# Patient Record
Sex: Male | Born: 1945
Health system: Southern US, Community
[De-identification: ages and names within clinical notes are randomized; demographics above are authoritative.]

---

## 2005-04-04 ENCOUNTER — Ambulatory Visit: Payer: Self-pay | Admitting: Gastroenterology

## 2005-04-16 ENCOUNTER — Ambulatory Visit: Payer: Self-pay | Admitting: Gastroenterology

## 2005-04-22 ENCOUNTER — Ambulatory Visit: Payer: Self-pay | Admitting: Gastroenterology

## 2005-05-28 ENCOUNTER — Ambulatory Visit: Payer: Self-pay | Admitting: Gastroenterology

## 2010-08-06 ENCOUNTER — Encounter: Payer: Self-pay | Admitting: Critical Care Medicine

## 2017-12-23 DIAGNOSIS — H6690 Otitis media, unspecified, unspecified ear: Secondary | ICD-10-CM | POA: Diagnosis not present

## 2017-12-23 DIAGNOSIS — Z6823 Body mass index (BMI) 23.0-23.9, adult: Secondary | ICD-10-CM | POA: Diagnosis not present

## 2017-12-23 DIAGNOSIS — Z87891 Personal history of nicotine dependence: Secondary | ICD-10-CM | POA: Diagnosis not present

## 2017-12-23 DIAGNOSIS — J449 Chronic obstructive pulmonary disease, unspecified: Secondary | ICD-10-CM | POA: Diagnosis not present

## 2017-12-23 DIAGNOSIS — F419 Anxiety disorder, unspecified: Secondary | ICD-10-CM | POA: Diagnosis not present

## 2017-12-23 DIAGNOSIS — Z299 Encounter for prophylactic measures, unspecified: Secondary | ICD-10-CM | POA: Diagnosis not present

## 2017-12-24 DIAGNOSIS — J441 Chronic obstructive pulmonary disease with (acute) exacerbation: Secondary | ICD-10-CM | POA: Diagnosis not present

## 2017-12-29 DIAGNOSIS — E785 Hyperlipidemia, unspecified: Secondary | ICD-10-CM | POA: Diagnosis not present

## 2017-12-29 DIAGNOSIS — J449 Chronic obstructive pulmonary disease, unspecified: Secondary | ICD-10-CM | POA: Diagnosis not present

## 2017-12-29 DIAGNOSIS — I10 Essential (primary) hypertension: Secondary | ICD-10-CM | POA: Diagnosis not present

## 2017-12-29 DIAGNOSIS — M19012 Primary osteoarthritis, left shoulder: Secondary | ICD-10-CM | POA: Diagnosis not present

## 2017-12-29 DIAGNOSIS — Z6822 Body mass index (BMI) 22.0-22.9, adult: Secondary | ICD-10-CM | POA: Diagnosis not present

## 2017-12-29 DIAGNOSIS — Z9981 Dependence on supplemental oxygen: Secondary | ICD-10-CM | POA: Diagnosis not present

## 2017-12-29 DIAGNOSIS — F418 Other specified anxiety disorders: Secondary | ICD-10-CM | POA: Diagnosis not present

## 2018-01-13 DIAGNOSIS — Z23 Encounter for immunization: Secondary | ICD-10-CM | POA: Diagnosis not present

## 2018-01-13 DIAGNOSIS — Z6822 Body mass index (BMI) 22.0-22.9, adult: Secondary | ICD-10-CM | POA: Diagnosis not present

## 2018-01-13 DIAGNOSIS — J449 Chronic obstructive pulmonary disease, unspecified: Secondary | ICD-10-CM | POA: Diagnosis not present

## 2018-01-13 DIAGNOSIS — I10 Essential (primary) hypertension: Secondary | ICD-10-CM | POA: Diagnosis not present

## 2018-01-13 DIAGNOSIS — Z299 Encounter for prophylactic measures, unspecified: Secondary | ICD-10-CM | POA: Diagnosis not present

## 2018-01-13 DIAGNOSIS — K146 Glossodynia: Secondary | ICD-10-CM | POA: Diagnosis not present

## 2018-01-23 DIAGNOSIS — J441 Chronic obstructive pulmonary disease with (acute) exacerbation: Secondary | ICD-10-CM | POA: Diagnosis not present

## 2018-02-03 DIAGNOSIS — E039 Hypothyroidism, unspecified: Secondary | ICD-10-CM | POA: Diagnosis not present

## 2018-02-03 DIAGNOSIS — Z1331 Encounter for screening for depression: Secondary | ICD-10-CM | POA: Diagnosis not present

## 2018-02-03 DIAGNOSIS — Z0000 Encounter for general adult medical examination without abnormal findings: Secondary | ICD-10-CM | POA: Diagnosis not present

## 2018-02-03 DIAGNOSIS — Z6823 Body mass index (BMI) 23.0-23.9, adult: Secondary | ICD-10-CM | POA: Diagnosis not present

## 2018-02-03 DIAGNOSIS — Z299 Encounter for prophylactic measures, unspecified: Secondary | ICD-10-CM | POA: Diagnosis not present

## 2018-02-03 DIAGNOSIS — Z1339 Encounter for screening examination for other mental health and behavioral disorders: Secondary | ICD-10-CM | POA: Diagnosis not present

## 2018-02-03 DIAGNOSIS — F419 Anxiety disorder, unspecified: Secondary | ICD-10-CM | POA: Diagnosis not present

## 2018-02-03 DIAGNOSIS — Z1211 Encounter for screening for malignant neoplasm of colon: Secondary | ICD-10-CM | POA: Diagnosis not present

## 2018-02-03 DIAGNOSIS — Z7189 Other specified counseling: Secondary | ICD-10-CM | POA: Diagnosis not present

## 2018-02-03 DIAGNOSIS — Z79899 Other long term (current) drug therapy: Secondary | ICD-10-CM | POA: Diagnosis not present

## 2018-02-03 DIAGNOSIS — I10 Essential (primary) hypertension: Secondary | ICD-10-CM | POA: Diagnosis not present

## 2018-02-03 DIAGNOSIS — Z125 Encounter for screening for malignant neoplasm of prostate: Secondary | ICD-10-CM | POA: Diagnosis not present

## 2018-02-09 ENCOUNTER — Ambulatory Visit (INDEPENDENT_AMBULATORY_CARE_PROVIDER_SITE_OTHER): Payer: Medicare HMO | Admitting: Otolaryngology

## 2018-02-09 DIAGNOSIS — R070 Pain in throat: Secondary | ICD-10-CM

## 2018-02-09 DIAGNOSIS — H9201 Otalgia, right ear: Secondary | ICD-10-CM | POA: Diagnosis not present

## 2018-02-23 DIAGNOSIS — J441 Chronic obstructive pulmonary disease with (acute) exacerbation: Secondary | ICD-10-CM | POA: Diagnosis not present

## 2018-02-25 ENCOUNTER — Other Ambulatory Visit (INDEPENDENT_AMBULATORY_CARE_PROVIDER_SITE_OTHER): Payer: Self-pay | Admitting: Otolaryngology

## 2018-02-25 DIAGNOSIS — K148 Other diseases of tongue: Secondary | ICD-10-CM

## 2018-02-25 DIAGNOSIS — R220 Localized swelling, mass and lump, head: Principal | ICD-10-CM

## 2018-03-03 ENCOUNTER — Ambulatory Visit (HOSPITAL_COMMUNITY)
Admission: RE | Admit: 2018-03-03 | Discharge: 2018-03-03 | Disposition: A | Discharge status: Home / Self Care | Payer: Medicare HMO | Source: Ambulatory Visit | Attending: Otolaryngology | Admitting: Otolaryngology

## 2018-03-03 DIAGNOSIS — R220 Localized swelling, mass and lump, head: Secondary | ICD-10-CM

## 2018-03-03 DIAGNOSIS — K148 Other diseases of tongue: Secondary | ICD-10-CM | POA: Insufficient documentation

## 2018-03-03 LAB — POCT I-STAT CREATININE: Creatinine, Ser: 1 mg/dL (ref 0.61–1.24)

## 2018-03-03 MED ORDER — GADOBUTROL 1 MMOL/ML IV SOLN
5.0000 mL | Freq: Once | INTRAVENOUS | Status: AC | PRN
  Administered 2018-03-03: 5 mL via INTRAVENOUS

## 2018-03-25 DIAGNOSIS — J441 Chronic obstructive pulmonary disease with (acute) exacerbation: Secondary | ICD-10-CM | POA: Diagnosis not present

## 2018-04-25 DIAGNOSIS — J441 Chronic obstructive pulmonary disease with (acute) exacerbation: Secondary | ICD-10-CM | POA: Diagnosis not present

## 2018-05-07 DIAGNOSIS — Z299 Encounter for prophylactic measures, unspecified: Secondary | ICD-10-CM | POA: Diagnosis not present

## 2018-05-07 DIAGNOSIS — J449 Chronic obstructive pulmonary disease, unspecified: Secondary | ICD-10-CM | POA: Diagnosis not present

## 2018-05-07 DIAGNOSIS — Z87891 Personal history of nicotine dependence: Secondary | ICD-10-CM | POA: Diagnosis not present

## 2018-05-07 DIAGNOSIS — I10 Essential (primary) hypertension: Secondary | ICD-10-CM | POA: Diagnosis not present

## 2018-05-07 DIAGNOSIS — Z6824 Body mass index (BMI) 24.0-24.9, adult: Secondary | ICD-10-CM | POA: Diagnosis not present

## 2018-05-13 DIAGNOSIS — I10 Essential (primary) hypertension: Secondary | ICD-10-CM | POA: Diagnosis not present

## 2018-05-13 DIAGNOSIS — Z87891 Personal history of nicotine dependence: Secondary | ICD-10-CM | POA: Diagnosis not present

## 2018-05-13 DIAGNOSIS — R29715 NIHSS score 15: Secondary | ICD-10-CM | POA: Diagnosis not present

## 2018-05-13 DIAGNOSIS — R0600 Dyspnea, unspecified: Secondary | ICD-10-CM | POA: Diagnosis not present

## 2018-05-13 DIAGNOSIS — R29810 Facial weakness: Secondary | ICD-10-CM | POA: Diagnosis not present

## 2018-05-13 DIAGNOSIS — R4781 Slurred speech: Secondary | ICD-10-CM | POA: Diagnosis not present

## 2018-05-13 DIAGNOSIS — I63232 Cerebral infarction due to unspecified occlusion or stenosis of left carotid arteries: Secondary | ICD-10-CM | POA: Diagnosis not present

## 2018-05-13 DIAGNOSIS — R404 Transient alteration of awareness: Secondary | ICD-10-CM | POA: Diagnosis not present

## 2018-05-13 DIAGNOSIS — J449 Chronic obstructive pulmonary disease, unspecified: Secondary | ICD-10-CM | POA: Diagnosis not present

## 2018-05-13 DIAGNOSIS — I63512 Cerebral infarction due to unspecified occlusion or stenosis of left middle cerebral artery: Secondary | ICD-10-CM | POA: Diagnosis not present

## 2018-05-13 DIAGNOSIS — E7800 Pure hypercholesterolemia, unspecified: Secondary | ICD-10-CM | POA: Diagnosis not present

## 2018-05-13 DIAGNOSIS — I639 Cerebral infarction, unspecified: Secondary | ICD-10-CM | POA: Diagnosis not present

## 2018-05-13 DIAGNOSIS — Z79899 Other long term (current) drug therapy: Secondary | ICD-10-CM | POA: Diagnosis not present

## 2018-05-13 DIAGNOSIS — R0689 Other abnormalities of breathing: Secondary | ICD-10-CM | POA: Diagnosis not present

## 2018-05-13 DIAGNOSIS — J80 Acute respiratory distress syndrome: Secondary | ICD-10-CM | POA: Diagnosis not present

## 2018-05-13 DIAGNOSIS — I6522 Occlusion and stenosis of left carotid artery: Secondary | ICD-10-CM | POA: Diagnosis not present

## 2018-05-13 DIAGNOSIS — I6389 Other cerebral infarction: Secondary | ICD-10-CM | POA: Diagnosis not present

## 2018-05-14 DIAGNOSIS — I63522 Cerebral infarction due to unspecified occlusion or stenosis of left anterior cerebral artery: Secondary | ICD-10-CM | POA: Diagnosis not present

## 2018-05-14 DIAGNOSIS — I6389 Other cerebral infarction: Secondary | ICD-10-CM | POA: Diagnosis not present

## 2018-05-14 DIAGNOSIS — Z515 Encounter for palliative care: Secondary | ICD-10-CM | POA: Diagnosis not present

## 2018-05-14 DIAGNOSIS — R1310 Dysphagia, unspecified: Secondary | ICD-10-CM | POA: Diagnosis not present

## 2018-05-14 DIAGNOSIS — K220 Achalasia of cardia: Secondary | ICD-10-CM | POA: Diagnosis not present

## 2018-05-14 DIAGNOSIS — I69391 Dysphagia following cerebral infarction: Secondary | ICD-10-CM | POA: Diagnosis not present

## 2018-05-14 DIAGNOSIS — E7800 Pure hypercholesterolemia, unspecified: Secondary | ICD-10-CM | POA: Diagnosis not present

## 2018-05-14 DIAGNOSIS — R4701 Aphasia: Secondary | ICD-10-CM | POA: Diagnosis not present

## 2018-05-14 DIAGNOSIS — R4702 Dysphasia: Secondary | ICD-10-CM | POA: Diagnosis not present

## 2018-05-14 DIAGNOSIS — R0689 Other abnormalities of breathing: Secondary | ICD-10-CM | POA: Diagnosis not present

## 2018-05-14 DIAGNOSIS — R531 Weakness: Secondary | ICD-10-CM | POA: Diagnosis not present

## 2018-05-14 DIAGNOSIS — G238 Other specified degenerative diseases of basal ganglia: Secondary | ICD-10-CM | POA: Diagnosis not present

## 2018-05-14 DIAGNOSIS — R1312 Dysphagia, oropharyngeal phase: Secondary | ICD-10-CM | POA: Diagnosis not present

## 2018-05-14 DIAGNOSIS — R0602 Shortness of breath: Secondary | ICD-10-CM | POA: Diagnosis not present

## 2018-05-14 DIAGNOSIS — R509 Fever, unspecified: Secondary | ICD-10-CM | POA: Diagnosis not present

## 2018-05-14 DIAGNOSIS — J9601 Acute respiratory failure with hypoxia: Secondary | ICD-10-CM | POA: Diagnosis not present

## 2018-05-14 DIAGNOSIS — R05 Cough: Secondary | ICD-10-CM | POA: Diagnosis not present

## 2018-05-14 DIAGNOSIS — J449 Chronic obstructive pulmonary disease, unspecified: Secondary | ICD-10-CM | POA: Diagnosis not present

## 2018-05-14 DIAGNOSIS — I63232 Cerebral infarction due to unspecified occlusion or stenosis of left carotid arteries: Secondary | ICD-10-CM | POA: Diagnosis not present

## 2018-05-14 DIAGNOSIS — Z79899 Other long term (current) drug therapy: Secondary | ICD-10-CM | POA: Diagnosis not present

## 2018-05-14 DIAGNOSIS — R918 Other nonspecific abnormal finding of lung field: Secondary | ICD-10-CM | POA: Diagnosis not present

## 2018-05-14 DIAGNOSIS — D72829 Elevated white blood cell count, unspecified: Secondary | ICD-10-CM | POA: Diagnosis not present

## 2018-05-14 DIAGNOSIS — R739 Hyperglycemia, unspecified: Secondary | ICD-10-CM | POA: Diagnosis not present

## 2018-05-14 DIAGNOSIS — G8191 Hemiplegia, unspecified affecting right dominant side: Secondary | ICD-10-CM | POA: Diagnosis not present

## 2018-05-14 DIAGNOSIS — D649 Anemia, unspecified: Secondary | ICD-10-CM | POA: Diagnosis not present

## 2018-05-14 DIAGNOSIS — I480 Paroxysmal atrial fibrillation: Secondary | ICD-10-CM | POA: Diagnosis not present

## 2018-05-14 DIAGNOSIS — I4892 Unspecified atrial flutter: Secondary | ICD-10-CM | POA: Diagnosis not present

## 2018-05-14 DIAGNOSIS — D473 Essential (hemorrhagic) thrombocythemia: Secondary | ICD-10-CM | POA: Diagnosis not present

## 2018-05-14 DIAGNOSIS — J14 Pneumonia due to Hemophilus influenzae: Secondary | ICD-10-CM | POA: Diagnosis not present

## 2018-05-14 DIAGNOSIS — J441 Chronic obstructive pulmonary disease with (acute) exacerbation: Secondary | ICD-10-CM | POA: Diagnosis not present

## 2018-05-14 DIAGNOSIS — J189 Pneumonia, unspecified organism: Secondary | ICD-10-CM | POA: Diagnosis not present

## 2018-05-14 DIAGNOSIS — R7989 Other specified abnormal findings of blood chemistry: Secondary | ICD-10-CM | POA: Diagnosis not present

## 2018-05-14 DIAGNOSIS — I4891 Unspecified atrial fibrillation: Secondary | ICD-10-CM | POA: Diagnosis not present

## 2018-05-14 DIAGNOSIS — I519 Heart disease, unspecified: Secondary | ICD-10-CM | POA: Diagnosis not present

## 2018-05-14 DIAGNOSIS — R29715 NIHSS score 15: Secondary | ICD-10-CM | POA: Diagnosis not present

## 2018-05-14 DIAGNOSIS — N179 Acute kidney failure, unspecified: Secondary | ICD-10-CM | POA: Diagnosis not present

## 2018-05-14 DIAGNOSIS — E876 Hypokalemia: Secondary | ICD-10-CM | POA: Diagnosis not present

## 2018-05-14 DIAGNOSIS — E870 Hyperosmolality and hypernatremia: Secondary | ICD-10-CM | POA: Diagnosis not present

## 2018-05-14 DIAGNOSIS — R0682 Tachypnea, not elsewhere classified: Secondary | ICD-10-CM | POA: Diagnosis not present

## 2018-05-14 DIAGNOSIS — A419 Sepsis, unspecified organism: Secondary | ICD-10-CM | POA: Diagnosis not present

## 2018-05-14 DIAGNOSIS — T17320D Food in larynx causing asphyxiation, subsequent encounter: Secondary | ICD-10-CM | POA: Diagnosis not present

## 2018-05-14 DIAGNOSIS — Z4682 Encounter for fitting and adjustment of non-vascular catheter: Secondary | ICD-10-CM | POA: Diagnosis not present

## 2018-05-14 DIAGNOSIS — J984 Other disorders of lung: Secondary | ICD-10-CM | POA: Diagnosis not present

## 2018-05-14 DIAGNOSIS — I63512 Cerebral infarction due to unspecified occlusion or stenosis of left middle cerebral artery: Secondary | ICD-10-CM | POA: Diagnosis not present

## 2018-05-14 DIAGNOSIS — J9602 Acute respiratory failure with hypercapnia: Secondary | ICD-10-CM | POA: Diagnosis not present

## 2018-05-14 DIAGNOSIS — J9690 Respiratory failure, unspecified, unspecified whether with hypoxia or hypercapnia: Secondary | ICD-10-CM | POA: Diagnosis not present

## 2018-05-14 DIAGNOSIS — G936 Cerebral edema: Secondary | ICD-10-CM | POA: Diagnosis not present

## 2018-05-14 DIAGNOSIS — I10 Essential (primary) hypertension: Secondary | ICD-10-CM | POA: Diagnosis not present

## 2018-05-14 DIAGNOSIS — I6522 Occlusion and stenosis of left carotid artery: Secondary | ICD-10-CM | POA: Diagnosis not present

## 2018-05-14 DIAGNOSIS — I63412 Cerebral infarction due to embolism of left middle cerebral artery: Secondary | ICD-10-CM | POA: Diagnosis not present

## 2018-05-14 DIAGNOSIS — I639 Cerebral infarction, unspecified: Secondary | ICD-10-CM | POA: Diagnosis not present

## 2018-05-14 DIAGNOSIS — I63233 Cerebral infarction due to unspecified occlusion or stenosis of bilateral carotid arteries: Secondary | ICD-10-CM | POA: Diagnosis not present

## 2018-05-14 DIAGNOSIS — R0600 Dyspnea, unspecified: Secondary | ICD-10-CM | POA: Diagnosis not present

## 2018-05-14 DIAGNOSIS — Z87891 Personal history of nicotine dependence: Secondary | ICD-10-CM | POA: Diagnosis not present

## 2018-06-10 ENCOUNTER — Other Ambulatory Visit: Payer: Self-pay | Admitting: *Deleted

## 2018-06-10 NOTE — Patient Outreach (Signed)
[facility]) Care Management  06/10/2018  Adrian Zhang 03/23/1946 [id number]   Transition of Care Referral   Referral Date: 06/09/18 Referral Source:  [facility] inpatient referral  Date of Admission: 05/14/18 Diagnosis: Dx Notes  CEREBRAL INFARCT D/T EMBOLISM LT MID CEREBRL ART  Date of Discharge:Facility:  [facility] on 23-Jun-2018.  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 Lavina Hamman, RN, BSN, [place] Management Care Coordinator Direct Number [phone] Mobile number [phone]  Main [facility] number [phone] Fax number [phone]

## 2018-06-14 DEATH — deceased

## 2018-12-14 IMAGING — MR MR NECK SOFT TISSUE ONLY WO/W CM
8 of 11 series · 34 of 48 positions shown · IV contrast (gadavist)
Comparison: None.

CLINICAL DATA: Tongue mass. Patient reports right tongue pain and
swelling for 2 months

EXAM:
MRI OF THE NECK WITH CONTRAST
TECHNIQUE: Multiplanar, multisequence MR imaging was performed following the
administration of intravenous contrast.
CONTRAST:  5 cc Gadavist intravenous

[Series 2: T1 · coronal · 4.0mm · 0.75mm/px · 4 of 43 slices shown (1 of 4)]
[im 1/43]
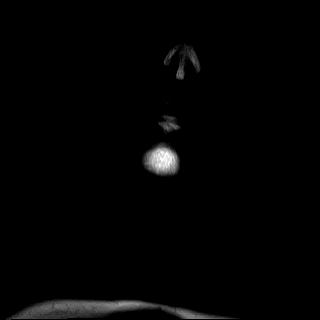
[im 15/43]
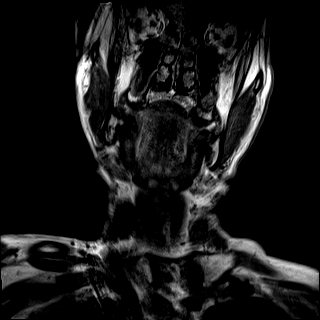
[im 29/43]
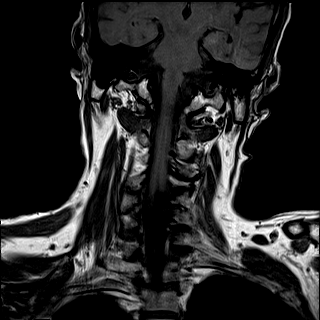
[im 43/43]
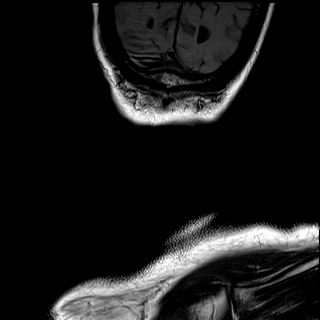

[Series 3: t2fs cor · coronal · 4.0mm · 0.75mm/px · 5 of 43 slices shown]
[im 1/43]
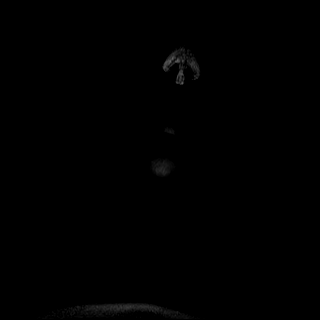
[im 11/43]
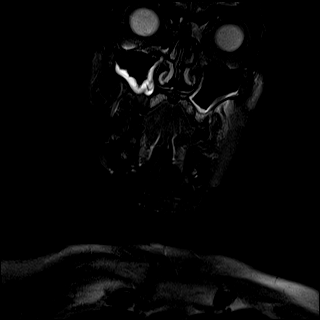
[im 22/43]
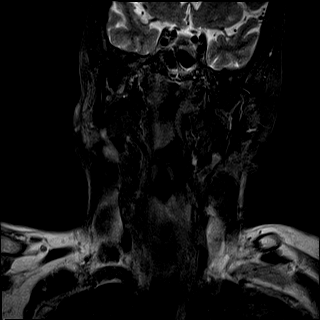
[im 32/43]
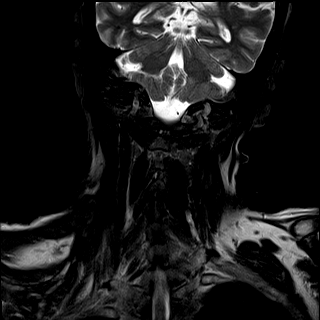
[im 43/43]
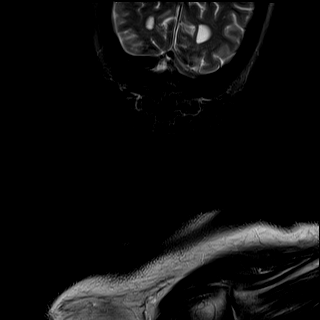

[Series 4: T1 · sagittal · 4.0mm · 0.75mm/px · 3 of 30 slices shown (2 of 4)]
[im 1/30]
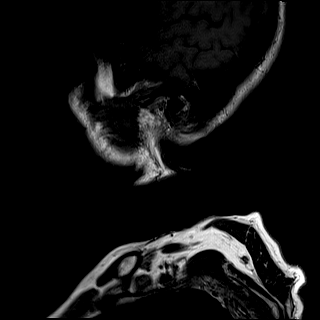
[im 15/30]
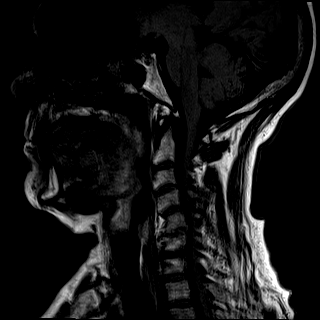
[im 30/30]
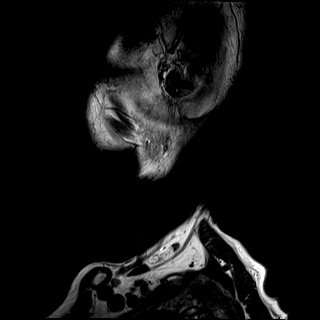

[Series 5: t2fs sag · sagittal · 4.0mm · 0.75mm/px · 3 of 30 slices shown]
[im 1/30]
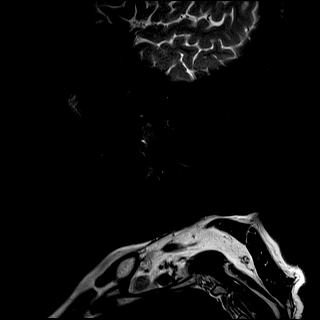
[im 15/30]
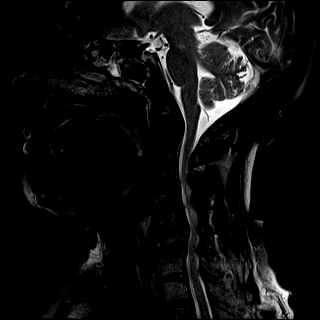
[im 30/30]
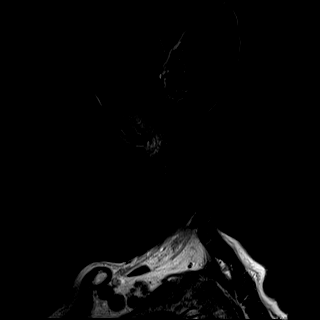

[Series 6: T1 · axial · 4.0mm · 0.69mm/px · z∈[-116,+53]mm · 5 of 40 slices shown (3 of 4)]
[im 1/40]
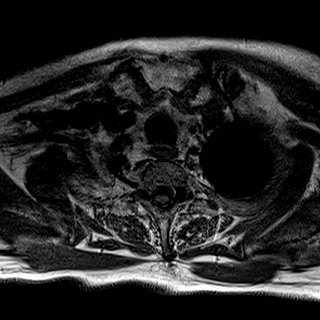
[im 10/40]
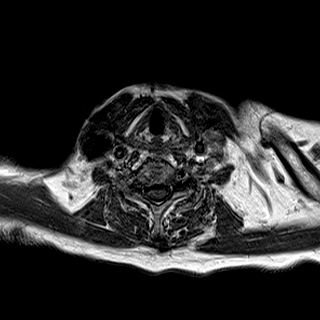
[im 20/40]
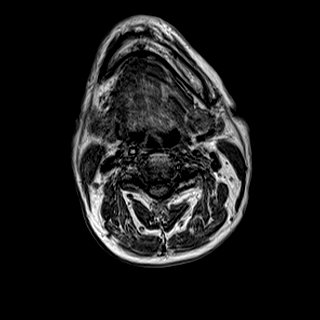
[im 30/40]
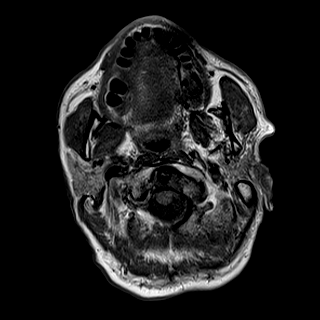
[im 40/40]
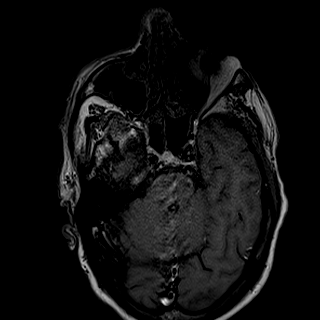

[Series 7: T1 · axial · 4.0mm · 0.69mm/px · z∈[-116,+53]mm · 5 of 40 slices shown (4 of 4)]
[im 1/40]
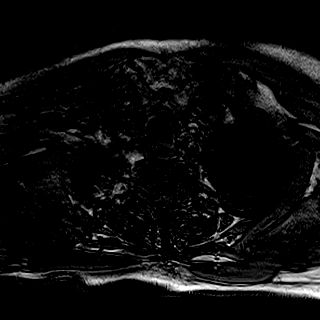
[im 10/40]
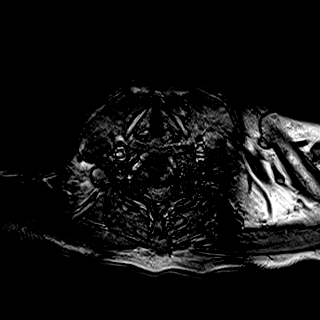
[im 20/40]
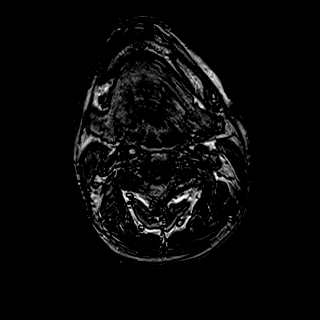
[im 30/40]
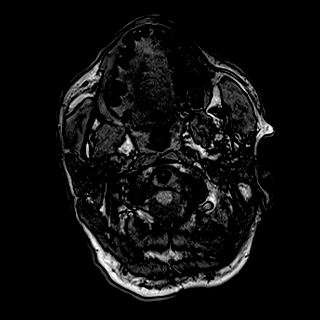
[im 40/40]
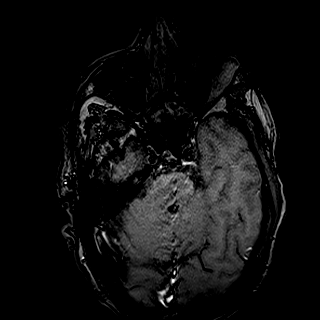

[Series 10: t1fs axial · axial · 4.0mm · 0.31mm/px · z∈[-113,+56]mm · 5 of 40 slices shown]
[im 1/40]
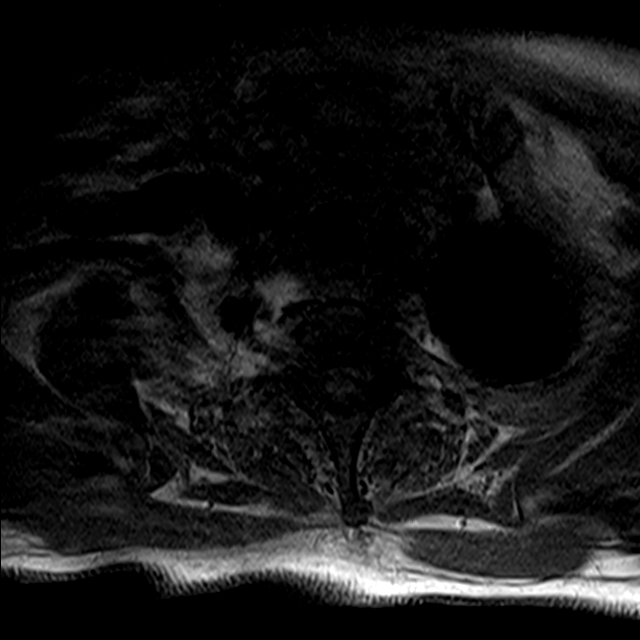
[im 10/40]
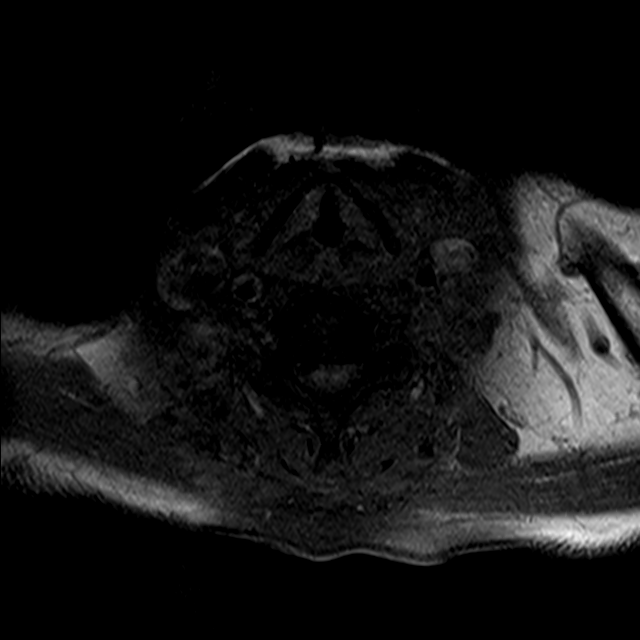
[im 20/40]
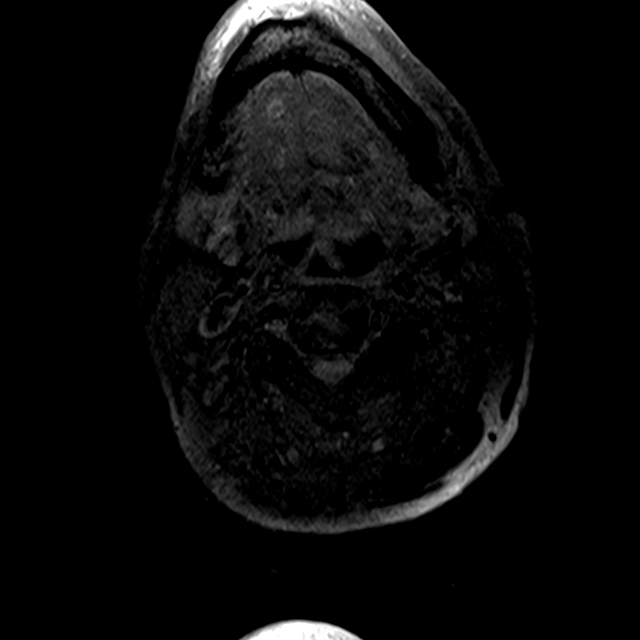
[im 30/40]
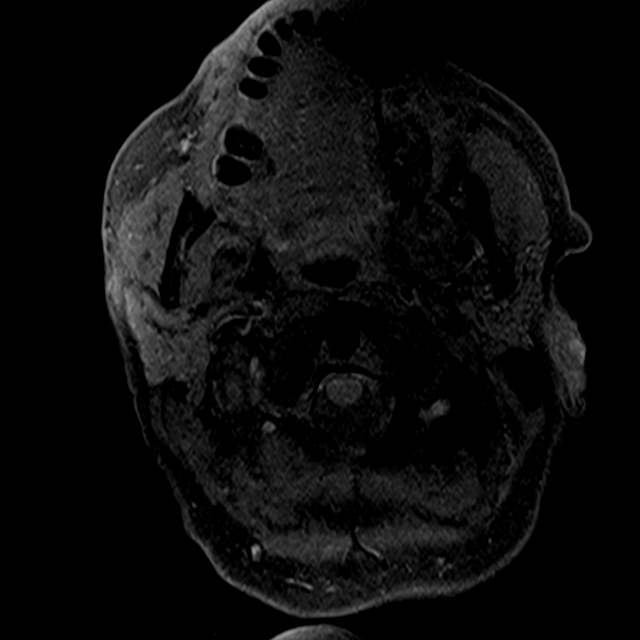
[im 40/40]
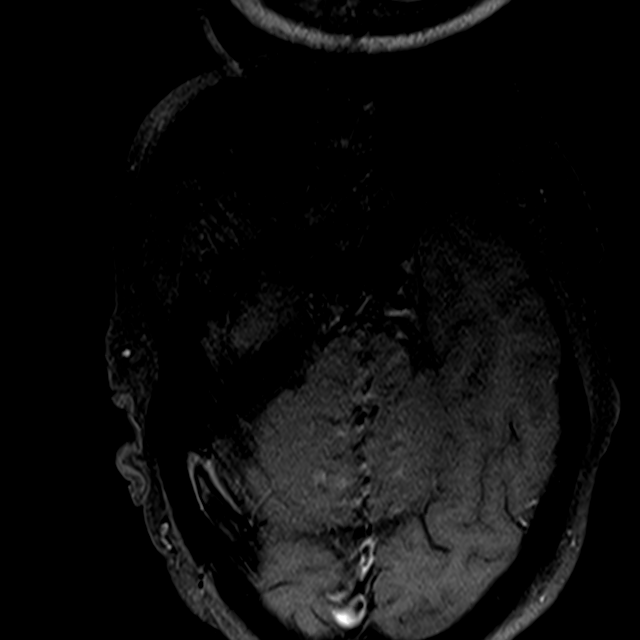

[Series 11: t2fs axial · axial · 4.0mm · 0.70mm/px · z∈[-115,+10]mm · 4 of 40 slices shown]
[im 1/40]
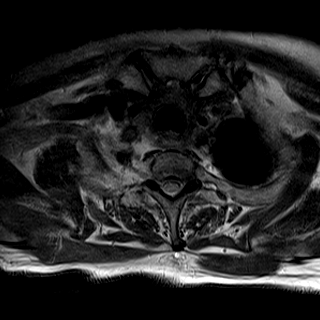
[im 10/40]
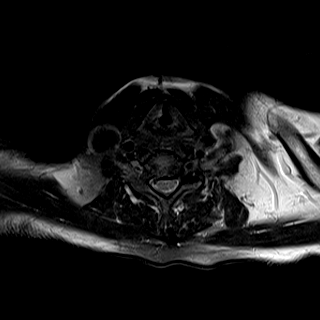
[im 20/40]
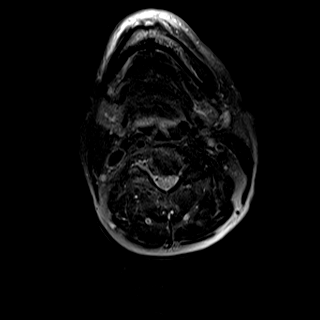
[im 30/40]
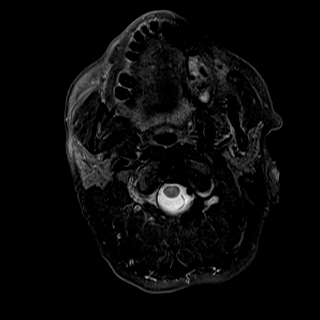

[34 of 48 positions shown; findings below may reference images not displayed]

FINDINGS: Pharynx and larynx: No evidence of mass, inflammation, or tongue
atrophy.

Salivary glands: Unremarkable. A small nodular density at the right
parotid tail is likely a lymph node.

Thyroid: Negative

Lymph nodes: None enlarged or abnormal morphology.

Vascular: Negative

Limited intracranial: Negative

Visualized orbits: Negative

Mastoids and visualized paranasal sinuses: Mild mucosal thickening
in paranasal sinuses.

Skeleton: Cervical facet spurring and C5-6/C6-7 disc degeneration.

Upper chest: Negative
IMPRESSION: Negative neck MRI.  No explanation for tongue symptoms.
